# Patient Record
Sex: Male | Born: 1980 | Race: Black or African American | Hispanic: No | Marital: Single | Smoking: Current some day smoker
Health system: Southern US, Community
[De-identification: ages and names within clinical notes are randomized; demographics above are authoritative.]

## PROBLEM LIST (undated history)

## (undated) DIAGNOSIS — E119 Type 2 diabetes mellitus without complications: Secondary | ICD-10-CM

---
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
## 2001-05-07 ENCOUNTER — Inpatient Hospital Stay (HOSPITAL_COMMUNITY): Admission: EM | Admit: 2001-05-07 | Discharge: 2001-05-08 | Payer: Self-pay | Admitting: Emergency Medicine

## 2002-07-24 ENCOUNTER — Emergency Department (HOSPITAL_COMMUNITY): Admission: EM | Admit: 2002-07-24 | Discharge: 2002-07-25 | Payer: Self-pay | Admitting: *Deleted

## 2002-07-25 ENCOUNTER — Emergency Department (HOSPITAL_COMMUNITY): Admission: EM | Admit: 2002-07-25 | Discharge: 2002-07-25 | Payer: Self-pay | Admitting: Emergency Medicine

## 2002-07-26 ENCOUNTER — Emergency Department (HOSPITAL_COMMUNITY): Admission: EM | Admit: 2002-07-26 | Discharge: 2002-07-26 | Payer: Self-pay | Admitting: Emergency Medicine

## 2002-07-27 ENCOUNTER — Emergency Department (HOSPITAL_COMMUNITY): Admission: EM | Admit: 2002-07-27 | Discharge: 2002-07-28 | Payer: Self-pay | Admitting: Emergency Medicine

## 2002-07-28 ENCOUNTER — Inpatient Hospital Stay (HOSPITAL_COMMUNITY): Admission: EM | Admit: 2002-07-28 | Discharge: 2002-07-31 | Payer: Self-pay | Admitting: Surgery

## 2002-07-28 ENCOUNTER — Encounter: Payer: Self-pay | Admitting: Surgery

## 2005-03-09 ENCOUNTER — Emergency Department (HOSPITAL_COMMUNITY): Admission: EM | Admit: 2005-03-09 | Discharge: 2005-03-09 | Payer: Self-pay | Admitting: Emergency Medicine

## 2005-10-04 ENCOUNTER — Emergency Department (HOSPITAL_COMMUNITY): Admission: EM | Admit: 2005-10-04 | Discharge: 2005-10-04 | Payer: Self-pay | Admitting: Emergency Medicine

## 2013-11-15 ENCOUNTER — Ambulatory Visit (INDEPENDENT_AMBULATORY_CARE_PROVIDER_SITE_OTHER): Payer: Self-pay | Admitting: Family Medicine

## 2013-11-15 VITALS — BP 120/68 | HR 97 | Temp 98.0°F | Resp 16 | Ht 69.5 in | Wt 188.8 lb

## 2013-11-15 DIAGNOSIS — Z131 Encounter for screening for diabetes mellitus: Secondary | ICD-10-CM

## 2013-11-15 DIAGNOSIS — Z0289 Encounter for other administrative examinations: Secondary | ICD-10-CM

## 2013-11-15 LAB — POCT GLYCOSYLATED HEMOGLOBIN (HGB A1C): Hemoglobin A1C: 5.5

## 2013-11-15 NOTE — Progress Notes (Signed)
Chief Complaint:  Chief Complaint  Patient presents with  . Employment Physical    DOT    HPI: Mathew Wilkerson is a 33 y.o. 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 He was in the ER for this. He states he never took the Levemir.  He does not remember when he first had insulin, he does not remember when he was ever told he had Diabetes. HE does not rememebr any of this. HE thinks he remember he was given a sample at [employer] place since he had no income.  Again he states he never used it.   History reviewed. No pertinent past medical history. History reviewed. No pertinent past surgical history. History   Social History  . Marital Status: Single    Spouse Name: N/A    Number of Children: N/A  . Years of Education: N/A   Social History Main Topics  . Smoking status: Current Some Day Smoker  . Smokeless tobacco: None  . Alcohol Use: None  . Drug Use: None  . Sexual Activity: None   Other Topics Concern  . None   Social History Narrative  . None   No family history on file. No Known Allergies Prior to Admission medications   Not on File     ROS: The patient denies fevers, chills, night sweats, unintentional weight loss, chest pain, palpitations, wheezing, dyspnea on exertion, nausea, vomiting, abdominal pain, dysuria, hematuria, melena, numbness, weakness, or tingling.  All other systems have been reviewed and were otherwise negative with the exception of those mentioned in the HPI and as above.    PHYSICAL EXAM: Filed Vitals:   11/15/13 1407  BP: 120/68  Pulse: 97  Temp: 98 F (36.7 C)  Resp: 16   Filed  Vitals:   11/15/13 1407  Height: 5' 9.5" (1.765 m)  Weight: 188 lb 12.8 oz (85.639 kg)   Body mass index is 27.49 kg/(m^2).  General: Alert, no acute distress HEENT:  Normocephalic, atraumatic, oropharynx patent. EOMI, PERRLA, tm normal  Cardiovascular:  Regular rate and rhythm, no rubs murmurs or gallops.  No Carotid bruits, radial pulse intact. No pedal edema.  Respiratory: Clear to auscultation bilaterally.  No wheezes, rales, or rhonchi.  No cyanosis, no use of accessory musculature GI: No organomegaly, abdomen is soft and non-tender, positive bowel sounds.  No masses. Skin: No rashes. Neurologic: Facial musculature symmetric. Psychiatric: Patient is appropriate throughout our interaction. Lymphatic: No cervical lymphadenopathy Musculoskeletal: Gait intact. 5/5 strength, 2/2 DTRs Neg inguinal hernia Neg scoliosis    LABS: Results for orders placed in visit on 11/15/13  POCT GLYCOSYLATED HEMOGLOBIN (HGB A1C)      Result Value Ref Range   Hemoglobin A1C 5.5       EKG/XRAY:   Primary read interpreted by Dr. Conley Rolls at [facility].   ASSESSMENT/PLAN: Encounter Diagnoses  Name Primary?  . Other general medical examination for administrative purposes Yes  . Screening for diabetes mellitus (DM)    No e/o DM  Base on labs done in office Hgb A1c was  approved by branch manage Geralynn Ochs [phone] He will be given a 2 year DOT.  F/u with PCP once he gets insurance for annual visit.    Gross sideeffects, risk and benefits, and alternatives of medications d/w patient. Patient is aware that all medications have potential sideeffects and we are unable to predict every sideeffect or drug-drug interaction that may occur.  Hamilton Capri PHUONG, DO 11/15/2013 3:40 PM

## 2014-03-09 ENCOUNTER — Emergency Department (HOSPITAL_COMMUNITY)
Admission: EM | Admit: 2014-03-09 | Discharge: 2014-03-09 | Disposition: A | Discharge status: Home / Self Care | Payer: BLUE CROSS/BLUE SHIELD | Attending: Emergency Medicine | Admitting: Emergency Medicine

## 2014-03-09 ENCOUNTER — Emergency Department (HOSPITAL_COMMUNITY)
Admission: EM | Admit: 2014-03-09 | Discharge: 2014-03-09 | Disposition: A | Discharge status: Home / Self Care | Payer: BLUE CROSS/BLUE SHIELD | Source: Home / Self Care

## 2014-03-09 ENCOUNTER — Emergency Department (HOSPITAL_COMMUNITY): Payer: BLUE CROSS/BLUE SHIELD

## 2014-03-09 ENCOUNTER — Encounter (HOSPITAL_COMMUNITY): Payer: Self-pay | Admitting: Emergency Medicine

## 2014-03-09 DIAGNOSIS — R1011 Right upper quadrant pain: Secondary | ICD-10-CM | POA: Insufficient documentation

## 2014-03-09 DIAGNOSIS — R112 Nausea with vomiting, unspecified: Secondary | ICD-10-CM | POA: Insufficient documentation

## 2014-03-09 DIAGNOSIS — Z720 Tobacco use: Secondary | ICD-10-CM | POA: Diagnosis not present

## 2014-03-09 DIAGNOSIS — R109 Unspecified abdominal pain: Secondary | ICD-10-CM

## 2014-03-09 DIAGNOSIS — E1165 Type 2 diabetes mellitus with hyperglycemia: Secondary | ICD-10-CM | POA: Diagnosis not present

## 2014-03-09 DIAGNOSIS — R739 Hyperglycemia, unspecified: Secondary | ICD-10-CM

## 2014-03-09 DIAGNOSIS — R079 Chest pain, unspecified: Secondary | ICD-10-CM | POA: Diagnosis not present

## 2014-03-09 DIAGNOSIS — Z794 Long term (current) use of insulin: Secondary | ICD-10-CM | POA: Insufficient documentation

## 2014-03-09 DIAGNOSIS — R197 Diarrhea, unspecified: Secondary | ICD-10-CM | POA: Diagnosis present

## 2014-03-09 HISTORY — DX: Type 2 diabetes mellitus without complications: E11.9

## 2014-03-09 LAB — CBG MONITORING, ED
Glucose-Capillary: 422 mg/dL — ABNORMAL HIGH (ref 70–99)
Glucose-Capillary: 331 mg/dL — ABNORMAL HIGH (ref 70–99)
Glucose-Capillary: 251 mg/dL — ABNORMAL HIGH (ref 70–99)

## 2014-03-09 LAB — URINALYSIS, ROUTINE W REFLEX MICROSCOPIC
BILIRUBIN URINE: NEGATIVE
Glucose, UA: >1000 mg/dL — AB
Hgb urine dipstick: NEGATIVE
KETONES UR: 40 mg/dL — AB
LEUKOCYTES UA: NEGATIVE
Nitrite: NEGATIVE
PH: 6 (ref 5.0–8.0)
Protein, ur: NEGATIVE mg/dL
Specific Gravity, Urine: 1.037 — ABNORMAL HIGH (ref 1.005–1.030)
UROBILINOGEN UA: 0.2 mg/dL (ref 0.0–1.0)

## 2014-03-09 LAB — COMPREHENSIVE METABOLIC PANEL
ALT: 18 U/L (ref 0–53)
AST: 30 U/L (ref 0–37)
Albumin: 4.8 g/dL (ref 3.5–5.2)
Alkaline Phosphatase: 135 U/L — ABNORMAL HIGH (ref 39–117)
Anion gap: 15 (ref 5–15)
BUN: 13 mg/dL (ref 6–23)
CO2: 23 mmol/L (ref 19–32)
Calcium: 9.7 mg/dL (ref 8.4–10.5)
Chloride: 95 mEq/L — ABNORMAL LOW (ref 96–112)
Creatinine, Ser: 1.35 mg/dL (ref 0.50–1.35)
GFR calc Af Amer: 79 mL/min — ABNORMAL LOW (ref >90)
GFR calc non Af Amer: 68 mL/min — ABNORMAL LOW (ref >90)
Glucose, Bld: 414 mg/dL — ABNORMAL HIGH (ref 70–99)
Potassium: 4.5 mmol/L (ref 3.5–5.1)
Sodium: 133 mmol/L — ABNORMAL LOW (ref 135–145)
Total Bilirubin: 2.2 mg/dL — ABNORMAL HIGH (ref 0.3–1.2)
Total Protein: 8.2 g/dL (ref 6.0–8.3)

## 2014-03-09 LAB — CBC WITH DIFFERENTIAL/PLATELET
Basophils Absolute: 0 10*3/uL (ref 0.0–0.1)
Basophils Relative: 0 % (ref 0–1)
Eosinophils Absolute: 0.1 10*3/uL (ref 0.0–0.7)
Eosinophils Relative: 1 % (ref 0–5)
HCT: 42.8 % (ref 39.0–52.0)
Hemoglobin: 15.1 g/dL (ref 13.0–17.0)
Lymphocytes Relative: 24 % (ref 12–46)
Lymphs Abs: 1.4 10*3/uL (ref 0.7–4.0)
MCH: 34.8 pg — ABNORMAL HIGH (ref 26.0–34.0)
MCHC: 35.3 g/dL (ref 30.0–36.0)
MCV: 98.6 fL (ref 78.0–100.0)
Monocytes Absolute: 0.5 10*3/uL (ref 0.1–1.0)
Monocytes Relative: 8 % (ref 3–12)
Neutro Abs: 4.1 10*3/uL (ref 1.7–7.7)
Neutrophils Relative %: 67 % (ref 43–77)
Platelets: 304 10*3/uL (ref 150–400)
RBC: 4.34 MIL/uL (ref 4.22–5.81)
RDW: 11.4 % — ABNORMAL LOW (ref 11.5–15.5)
WBC: 6.1 10*3/uL (ref 4.0–10.5)

## 2014-03-09 LAB — I-STAT CHEM 8, ED
BUN: 11 mg/dL (ref 6–23)
CALCIUM ION: 1.19 mmol/L (ref 1.12–1.23)
Chloride: 102 mEq/L (ref 96–112)
Creatinine, Ser: 0.9 mg/dL (ref 0.50–1.35)
Glucose, Bld: 270 mg/dL — ABNORMAL HIGH (ref 70–99)
HCT: 43 % (ref 39.0–52.0)
HEMOGLOBIN: 14.6 g/dL (ref 13.0–17.0)
Potassium: 3.9 mmol/L (ref 3.5–5.1)
SODIUM: 139 mmol/L (ref 135–145)
TCO2: 22 mmol/L (ref 0–100)

## 2014-03-09 LAB — URINE MICROSCOPIC-ADD ON

## 2014-03-09 LAB — KETONES, QUALITATIVE: Acetone, Bld: NEGATIVE

## 2014-03-09 LAB — TROPONIN I

## 2014-03-09 MED ORDER — SODIUM CHLORIDE 0.9 % IV BOLUS (SEPSIS)
1000.0000 mL | Freq: Once | INTRAVENOUS | Status: AC
  Administered 2014-03-09: 1000 mL via INTRAVENOUS

## 2014-03-09 MED ORDER — INSULIN DETEMIR 100 UNIT/ML ~~LOC~~ SOLN: 10.0000 [IU] | Freq: Every day | SUBCUTANEOUS | Status: DC

## 2014-03-09 MED ORDER — SODIUM CHLORIDE 0.9 % IV BOLUS (SEPSIS)
500.0000 mL | Freq: Once | INTRAVENOUS | Status: AC
  Administered 2014-03-09: 500 mL via INTRAVENOUS

## 2014-03-09 NOTE — ED Notes (Signed)
Care Management at bedside

## 2014-03-09 NOTE — ED Notes (Signed)
Pt discharged by Donnetta SimpersJanee Crews

## 2014-03-09 NOTE — ED Provider Notes (Signed)
CSN: [id number]     Arrival date & time 03/09/14  1512 History   First MD Initiated Contact with Patient 03/09/14 1724     Chief Complaint  Patient presents with  . Emesis  . Diarrhea     (Consider location/radiation/quality/duration/timing/severity/associated sxs/prior Treatment) The history is provided by the patient. No language interpreter was used.  Mathew Wilkerson is a 34 year old male with past medical history of type I diabetes presenting to the emergency department with nausea, vomiting, diarrhea that is been ongoing since Saturday. Patient reports that most of the diarrhea and emesis was on Saturday. Reported that on Saturday had approximately 3-4 episodes of emesis-mainly of food contents, NB/NB. Reported that since Saturday the episodes of emesis have decreased, reported that he had one episode of emesis today-mainly of food contents. Reported that he has been having diarrhea intermittently but has not subsided since Saturday-reported 2 episodes of watery diarrhea on Saturday, stated that the stools have now returned to normal. Stated that he's been eating lite - mainly of chicken and rice. Stated that he has been having some abdominal discomfort described as a cramping, soreness sensation worse with vomiting and diarrhea. Stated that he has been having chest pain localized left-sided chest described as palpitations. Stated he's been feeling lightheaded and dizzy. Stated that he has been having increased urinary frequency and thirst. Reported that he's been taking insulin, normally supposed to be taking Levemir, but has been taking insulin that he can afford and goes on a sliding scale. Reported that his last sugar was calculated yesterday at 245. Denied shortness of breath, difficulty breathing, changes to gas, neck pain, fever, hematuria, melena, hematochezia, syncope, sick contacts. PCP none  Past Medical History  Diagnosis Date  . Diabetes mellitus without complication    History  reviewed. No pertinent past surgical history. History reviewed. No pertinent family history. History  Substance Use Topics  . Smoking status: Current Some Day Smoker  . Smokeless tobacco: Not on file  . Alcohol Use: Yes    Review of Systems  Constitutional: Positive for chills. Negative for fever.  Eyes: Negative for visual disturbance.  Respiratory: Negative for chest tightness and shortness of breath.   Cardiovascular: Positive for chest pain.  Gastrointestinal: Positive for nausea, vomiting, abdominal pain and diarrhea. Negative for constipation, blood in stool and anal bleeding.  Endocrine: Positive for polyphagia and polyuria.  Genitourinary: Positive for frequency. Negative for dysuria, decreased urine volume and enuresis.  Musculoskeletal: Negative for back pain and neck pain.  Neurological: Positive for light-headedness. Negative for dizziness, weakness and headaches.      Allergies  Review of patient's allergies indicates no known allergies.  Home Medications   Prior to Admission medications   Medication Sig Start Date End Date Taking? Authorizing Provider  INSULIN REGULAR HUMAN IN Inject 5 Units as directed 2 (two) times daily.   Yes Historical Provider, MD  insulin detemir (LEVEMIR) 100 UNIT/ML injection Inject 0.1 mLs (10 Units total) into the skin daily. 03/09/14   Pauline Trainer, PA-C   BP 117/74 mmHg  Pulse 83  Temp(Src) 98.1 F (36.7 C) (Oral)  Resp 16  SpO2 99% Physical Exam  Constitutional: He is oriented to person, place, and time. He appears well-developed and well-nourished. No distress.  HENT:  Head: Normocephalic and atraumatic.  Mouth/Throat: Oropharynx is clear and moist. No oropharyngeal exudate.  Eyes: Conjunctivae and EOM are normal. Right eye exhibits no discharge. Left eye exhibits no discharge.  Neck: Normal range of motion. Neck supple.  No tracheal deviation present.  Cardiovascular: Normal rate, regular rhythm and normal heart sounds.   Exam reveals no friction rub.   No murmur heard. Pulmonary/Chest: Effort normal and breath sounds normal. No respiratory distress. He has no wheezes. He has no rales.  Abdominal: Soft. Bowel sounds are normal. He exhibits no distension. There is tenderness in the right upper quadrant. There is no rebound and no guarding.  Negative abdominal distention Bowel sounds normoactive in all 4 quadrants Abdomen soft upon palpation Mild discomfort upon palpation to the right upper quadrant Negative peritoneal signs  Musculoskeletal: Normal range of motion.  Full ROM to upper and lower extremities without difficulty noted, negative ataxia noted.  Lymphadenopathy:    He has no cervical adenopathy.  Neurological: He is alert and oriented to person, place, and time. No cranial nerve deficit. He exhibits normal muscle tone. Coordination normal.  Skin: Skin is warm and dry. No rash noted. He is not diaphoretic. No erythema.  Psychiatric: He has a normal mood and affect. His behavior is normal. Thought content normal.  Nursing note and vitals reviewed.   ED Course  Procedures (including critical care time)  Results for orders placed or performed during the hospital encounter of 03/09/14  CBC with Differential  Result Value Ref Range   WBC 6.1 4.0 - 10.5 K/uL   RBC 4.34 4.22 - 5.81 MIL/uL   Hemoglobin 15.1 13.0 - 17.0 g/dL   HCT 16.1 09.6 - 04.5 %   MCV 98.6 78.0 - 100.0 fL   MCH 34.8 (H) 26.0 - 34.0 pg   MCHC 35.3 30.0 - 36.0 g/dL   RDW 40.9 (L) 81.1 - 91.4 %   Platelets 304 150 - 400 K/uL   Neutrophils Relative % 67 43 - 77 %   Neutro Abs 4.1 1.7 - 7.7 K/uL   Lymphocytes Relative 24 12 - 46 %   Lymphs Abs 1.4 0.7 - 4.0 K/uL   Monocytes Relative 8 3 - 12 %   Monocytes Absolute 0.5 0.1 - 1.0 K/uL   Eosinophils Relative 1 0 - 5 %   Eosinophils Absolute 0.1 0.0 - 0.7 K/uL   Basophils Relative 0 0 - 1 %   Basophils Absolute 0.0 0.0 - 0.1 K/uL  Comprehensive metabolic panel  Result Value Ref  Range   Sodium 133 (L) 135 - 145 mmol/L   Potassium 4.5 3.5 - 5.1 mmol/L   Chloride 95 (L) 96 - 112 mEq/L   CO2 23 19 - 32 mmol/L   Glucose, Bld 414 (H) 70 - 99 mg/dL   BUN 13 6 - 23 mg/dL   Creatinine, Ser 7.82 0.50 - 1.35 mg/dL   Calcium 9.7 8.4 - 95.6 mg/dL   Total Protein 8.2 6.0 - 8.3 g/dL   Albumin 4.8 3.5 - 5.2 g/dL   AST 30 0 - 37 U/L   ALT 18 0 - 53 U/L   Alkaline Phosphatase 135 (H) 39 - 117 U/L   Total Bilirubin 2.2 (H) 0.3 - 1.2 mg/dL   GFR calc non Af Amer 68 (L) >90 mL/min   GFR calc Af Amer 79 (L) >90 mL/min   Anion gap 15 5 - 15  Urinalysis, Routine w reflex microscopic  Result Value Ref Range   Color, Urine YELLOW YELLOW   APPearance CLEAR CLEAR   Specific Gravity, Urine 1.037 (H) 1.005 - 1.030   pH 6.0 5.0 - 8.0   Glucose, UA >1000 (A) NEGATIVE mg/dL   Hgb urine dipstick NEGATIVE NEGATIVE  Bilirubin Urine NEGATIVE NEGATIVE   Ketones, ur 40 (A) NEGATIVE mg/dL   Protein, ur NEGATIVE NEGATIVE mg/dL   Urobilinogen, UA 0.2 0.0 - 1.0 mg/dL   Nitrite NEGATIVE NEGATIVE   Leukocytes, UA NEGATIVE NEGATIVE  Ketones, qualitative  Result Value Ref Range   Acetone, Bld NEGATIVE NEGATIVE  Troponin I  Result Value Ref Range   Troponin I <0.03 <0.031 ng/mL  Urine microscopic-add on  Result Value Ref Range   Squamous Epithelial / LPF RARE RARE  CBG monitoring, ED  Result Value Ref Range   Glucose-Capillary 422 (H) 70 - 99 mg/dL  CBG monitoring, ED  Result Value Ref Range   Glucose-Capillary 331 (H) 70 - 99 mg/dL  I-stat chem 8, ed  Result Value Ref Range   Sodium 139 135 - 145 mmol/L   Potassium 3.9 3.5 - 5.1 mmol/L   Chloride 102 96 - 112 mEq/L   BUN 11 6 - 23 mg/dL   Creatinine, Ser 1.610.90 0.50 - 1.35 mg/dL   Glucose, Bld 096270 (H) 70 - 99 mg/dL   Calcium, Ion 0.451.19 4.091.12 - 1.23 mmol/L   TCO2 22 0 - 100 mmol/L   Hemoglobin 14.6 13.0 - 17.0 g/dL   HCT 81.143.0 91.439.0 - 78.252.0 %  CBG monitoring, ED  Result Value Ref Range   Glucose-Capillary 251 (H) 70 - 99 mg/dL     Labs Review Labs Reviewed  CBC WITH DIFFERENTIAL - Abnormal; Notable for the following:    MCH 34.8 (*)    RDW 11.4 (*)    All other components within normal limits  COMPREHENSIVE METABOLIC PANEL - Abnormal; Notable for the following:    Sodium 133 (*)    Chloride 95 (*)    Glucose, Bld 414 (*)    Alkaline Phosphatase 135 (*)    Total Bilirubin 2.2 (*)    GFR calc non Af Amer 68 (*)    GFR calc Af Amer 79 (*)    All other components within normal limits  URINALYSIS, ROUTINE W REFLEX MICROSCOPIC - Abnormal; Notable for the following:    Specific Gravity, Urine 1.037 (*)    Glucose, UA >1000 (*)    Ketones, ur 40 (*)    All other components within normal limits  CBG MONITORING, ED - Abnormal; Notable for the following:    Glucose-Capillary 422 (*)    All other components within normal limits  CBG MONITORING, ED - Abnormal; Notable for the following:    Glucose-Capillary 331 (*)    All other components within normal limits  I-STAT CHEM 8, ED - Abnormal; Notable for the following:    Glucose, Bld 270 (*)    All other components within normal limits  CBG MONITORING, ED - Abnormal; Notable for the following:    Glucose-Capillary 251 (*)    All other components within normal limits  KETONES, QUALITATIVE  TROPONIN I  URINE MICROSCOPIC-ADD ON    Imaging Review [place] Abdomen Limited Ruq  03/09/2014   CLINICAL DATA:  Abdominal pain. History of diabetes. Initial encounter.  EXAM: US ABDOMEN LIMITED - RIGHT UPPER QUADRANT  COMPARISON:  None.  FINDINGS: Gallbladder:  No gallstones or wall thickening visualized. No sonographic Murphy sign noted.  Common bile duct:  Diameter: 2.5 mm.  No evidence of choledocholithiasis.  Liver:  No focal lesion identified. Within normal limits in parenchymal echogenicity.  IMPRESSION: Normal right upper quadrant abdominal ultrasound.   Electronically Signed   By: Roxy HorsemanBill  Veazey M.D.   On: 03/09/2014 21:26  EKG Interpretation   Date/Time:  Wednesday  March 09 2014 18:00:52 EST Ventricular Rate:  89 PR Interval:  187 QRS Duration: 81 QT Interval:  397 QTC Calculation: 483 R Axis:   93 Text Interpretation:  Sinus rhythm Borderline right axis deviation ST  elev, probable normal early repol pattern Borderline prolonged QT interval  No old tracing to compare Confirmed by [facility]  MD, DAVID ([id number]) on  03/09/2014 6:35:16 PM      8:49 PM This provider spoke with patient - recommended patient to be admitted to the hospital for IV fluids secondary to dehydration and poor control of glucose. Concerned secondary to patient being type I diabetic and using random insulin and random dosages of insulin for control-patient purchases insulin that he can afford at that time and varies in types of insulin. Patient does not one to be admitted, patient reported that he would like to go back to work tomorrow. Patient reports that he is feeling much better and that he would like to be discharged home at this time.  9:12 PM This provider spoke with Burna Mortimer, Case Management - discussed case without medications and PCP. Case management to see patient.   9:27 PM Burna Mortimer, Case manager, saw and assess patient. Reported that since patient has insurance, [employer] and [facility] patient can get Levemir fully covered. Highly recommended admission as well, patient continued to refuse secondary to starting a new job 2 months ago.  10:34 PM This provider discussed case in great detail with attending physician, Dr. Algis Downs. Ray reported that patient can be discharge if he does not want to stay in the hospital. Recommended patient being placed on Levemir if this worked in the past. Recommended pharmacy consult regarding dose of Levemir for the patient.  10:45 PM This provider spoke with Tammy Sours, Pharmacist. Discussed case in great detail. Recommended basic Levemir 10 Units per day - recommended for patient to discontinue the insulin regular, just to take Levemir as prescribed. Recommended  HbA1c to be rechecked in next visit.   MDM   Final diagnoses:  Abdominal pain  Hyperglycemia    Medications  sodium chloride 0.9 % bolus 1,000 mL (0 mLs Intravenous Stopped 03/09/14 2052)  sodium chloride 0.9 % bolus 500 mL (0 mLs Intravenous Stopped 03/09/14 2101)    Filed Vitals:   03/09/14 1800 03/09/14 1830 03/09/14 2100 03/09/14 2108  BP: 148/84 131/75 117/74 117/74  Pulse:    83  Temp:      TempSrc:      Resp: 18 23 10 16   SpO2:    99%   EKG noted sinus rhythm with a heart rate of 89 bpm with a right axis deviation, early repolarization-no old tracing. Troponin negative elevation. CBC negative elevated leukocytosis. Hemoglobin 15.1, hematocrit 42.8. CMP noted mild hyponatremia of 133. Alkaline phosphatase elevated at 135, bilirubin elevated at 2.2. Glucose elevated at 414 with negative elevated anion gap-15.0 mg/L. Ketones negative. Urinalysis noted-negative hemoglobin, nitrites or leukocytes. Ketones elevated at 40. Specific gravity elevated at 1.037. Right upper quadrant ultrasound unremarkable. Doubt DKA. Doubt acute cholecystitis/cholangitis. Patient given IV fluids while in ED setting. Repeat chem 8 after IV fluids administered noted glucose decreasing from 442 to 270 - anion gap negative elevation - 15.0 mg/L. Patient appeared to be dehydrated upon arrival to the ED-denied chest palpitations, chest pain, shortness of breath or difficulty breathing. Patient reported that he feels a lot better after the fluids were administered. Patient was seen and assessed by case management, as per  case management reported the patient is able to get Levemir secondary to insurance - has not been on Levemir since 2012. Appointment for outpatient follow-up will be scheduled for patient. Patient had a long discussion with case management regarding checking blood sugars daily-reported that strips can be purchased at [facility] or any drugstore. Patient does not appear to be in DKA. Patient presenting to  the ED with dehydration and hyperglycemia secondary to poor glucose control due to not using the proper medications/insulin. Patient stable, afebrile. Patient not septic appearing. This provider recommended admission for observation and IV fluid hydration-patient declines stating that he is unable to stay secondary to just starting his new job 2 months ago. Discussed concern, dangers of poor glucose control and type I diabetics such as DKA and death-patient understood and continued to decline admission. Patient seen and assessed by attending physician, Dr. Charlann Boxer - recommended patient to discharged if patient does not want to stay and recommended pharmacy to be consulted. Pharmacy consulted and recommended Levemir 10 U daily. Patient has not had an episode of emesis while in the ED setting. Patient stable, afebrile. Patient not septic appearing. Discussed with patient to rest and stay hydrated. Discussed with patient importance of monitoring sugar levels. Discussed with patient to keep appointment on Monday, 03/14/2014. Discussed with patient to closely monitor symptoms and if symptoms are to worsen or change to report back to the ED - strict return instructions given.  Patient agreed to plan of care, understood, all questions answered.   Raymon Mutton, PA-C 03/09/14 1610  Hilario Quarry, MD 03/09/14 [phone]

## 2014-03-09 NOTE — ED Notes (Signed)
CBG 422.Marland Kitchen.Marland Kitchen.Nurse notified

## 2014-03-09 NOTE — Discharge Instructions (Signed)
Please call your doctor for a followup appointment within 24-48 hours. When you talk to your doctor please let them know that you were seen in the emergency department and have them acquire all of your records so that they can discuss the findings with you and formulate a treatment plan to fully care for your new and ongoing problems. Please keep appointment with Clinic on Monday, 03/14/2014 at 10:00AM.  Please take Levemir as prescribed Please discontinue the insulin regular that you have been taking Please rest and stay hydrated Please continue to monitor sugar levels daily Please continue to monitor symptoms closely and if symptoms are to worsen or change (fever greater than 101, chills, sweating, nausea, vomiting, chest pain, shortness of breathe, difficulty breathing, weakness, numbness, tingling, worsening or changes to pain pattern, sweating, dizziness, headache, visual changes, increased urination, increased thirst) please report back to the Emergency Department immediately.    Blood Glucose Monitoring Monitoring your blood glucose (also know as blood sugar) helps you to manage your diabetes. It also helps you and your health care provider monitor your diabetes and determine how well your treatment plan is working. WHY SHOULD YOU MONITOR YOUR BLOOD GLUCOSE?  It can help you understand how food, exercise, and medicine affect your blood glucose.  It allows you to know what your blood glucose is at any given moment. You can quickly tell if you are having low blood glucose (hypoglycemia) or high blood glucose (hyperglycemia).  It can help you and your health care provider know how to adjust your medicines.  It can help you understand how to manage an illness or adjust medicine for exercise. WHEN SHOULD YOU TEST? Your health care provider will help you decide how often you should check your blood glucose. This may depend on the type of diabetes you have, your diabetes control, or the types of  medicines you are taking. Be sure to write down all of your blood glucose readings so that this information can be reviewed with your health care provider. See below for examples of testing times that your health care provider may suggest. Type 1 Diabetes  Test 4 times a day if you are in good control, using an insulin pump, or perform multiple daily injections.  If your diabetes is not well controlled or if you are sick, you may need to monitor more often.  It is a good idea to also monitor:  Before and after exercise.  Between meals and 2 hours after a meal.  Occasionally between 2:00 a.m. and 3:00 a.m. Type 2 Diabetes  It can vary with each person, but generally, if you are on insulin, test 4 times a day.  If you take medicines by mouth (orally), test 2 times a day.  If you are on a controlled diet, test once a day.  If your diabetes is not well controlled or if you are sick, you may need to monitor more often. HOW TO MONITOR YOUR BLOOD GLUCOSE Supplies Needed  Blood glucose meter.  Test strips for your meter. Each meter has its own strips. You must use the strips that go with your own meter.  A pricking needle (lancet).  A device that holds the lancet (lancing device).  A journal or log book to write down your results. Procedure  Wash your hands with soap and water. Alcohol is not preferred.  Prick the side of your finger (not the tip) with the lancet.  Gently milk the finger until a small drop of blood appears.  Follow the instructions that come with your meter for inserting the test strip, applying blood to the strip, and using your blood glucose meter. Other Areas to Get Blood for Testing Some meters allow you to use other areas of your body (other than your finger) to test your blood. These areas are called alternative sites. The most common alternative sites are:  The forearm.  The thigh.  The back area of the lower leg.  The palm of the hand. The blood  flow in these areas is slower. Therefore, the blood glucose values you get may be delayed, and the numbers are different from what you would get from your fingers. Do not use alternative sites if you think you are having hypoglycemia. Your reading will not be accurate. Always use a finger if you are having hypoglycemia. Also, if you cannot feel your lows (hypoglycemia unawareness), always use your fingers for your blood glucose checks. ADDITIONAL TIPS FOR GLUCOSE MONITORING  Do not reuse lancets.  Always carry your supplies with you.  All blood glucose meters have a 24-hour "hotline" number to call if you have questions or need help.  Adjust (calibrate) your blood glucose meter with a control solution after finishing a few boxes of strips. BLOOD GLUCOSE RECORD KEEPING It is a good idea to keep a daily record or log of your blood glucose readings. Most glucose meters, if not all, keep your glucose records stored in the meter. Some meters come with the ability to download your records to your home computer. Keeping a record of your blood glucose readings is especially helpful if you are wanting to look for patterns. Make notes to go along with the blood glucose readings because you might forget what happened at that exact time. Keeping good records helps you and your health care provider to work together to achieve good diabetes management.  Document Released: 02/14/2003 Document Revised: 06/28/2013 Document Reviewed: 07/06/2012 [facility] Patient Information 2015 [place], [place]. This information is not intended to replace advice given to you by your health care provider. Make sure you discuss any questions you have with your health care provider.  Hyperglycemia Hyperglycemia occurs when the glucose (sugar) in your blood is too high. Hyperglycemia can happen for many reasons, but it most often happens to people who do not know they have diabetes or are not managing their diabetes properly.  CAUSES    Whether you have diabetes or not, there are other causes of hyperglycemia. Hyperglycemia can occur when you have diabetes, but it can also occur in other situations that you might not be as aware of, such as: Diabetes  If you have diabetes and are having problems controlling your blood glucose, hyperglycemia could occur because of some of the following reasons:  Not following your meal plan.  Not taking your diabetes medications or not taking it properly.  Exercising less or doing less activity than you normally do.  Being sick. Pre-diabetes  This cannot be ignored. Before people develop Type 2 diabetes, they almost always have "pre-diabetes." This is when your blood glucose levels are higher than normal, but not yet high enough to be diagnosed as diabetes. Research has shown that some long-term damage to the body, especially the heart and circulatory system, may already be occurring during pre-diabetes. If you take action to manage your blood glucose when you have pre-diabetes, you may delay or prevent Type 2 diabetes from developing. Stress  If you have diabetes, you may be "diet" controlled or on oral medications  or insulin to control your diabetes. However, you may find that your blood glucose is higher than usual in the hospital whether you have diabetes or not. This is often referred to as "stress hyperglycemia." Stress can elevate your blood glucose. This happens because of hormones put out by the body during times of stress. If stress has been the cause of your high blood glucose, it can be followed regularly by your caregiver. That way he/she can make sure your hyperglycemia does not continue to get worse or progress to diabetes. Steroids  Steroids are medications that act on the infection fighting system (immune system) to block inflammation or infection. One side effect can be a rise in blood glucose. Most people can produce enough extra insulin to allow for this rise, but for those  who cannot, steroids make blood glucose levels go even higher. It is not unusual for steroid treatments to "uncover" diabetes that is developing. It is not always possible to determine if the hyperglycemia will go away after the steroids are stopped. A special blood test called an A1c is sometimes done to determine if your blood glucose was elevated before the steroids were started. SYMPTOMS  Thirsty.  Frequent urination.  Dry mouth.  Blurred vision.  Tired or fatigue.  Weakness.  Sleepy.  Tingling in feet or leg. DIAGNOSIS  Diagnosis is made by monitoring blood glucose in one or all of the following ways:  A1c test. This is a chemical found in your blood.  Fingerstick blood glucose monitoring.  Laboratory results. TREATMENT  First, knowing the cause of the hyperglycemia is important before the hyperglycemia can be treated. Treatment may include, but is not be limited to:  Education.  Change or adjustment in medications.  Change or adjustment in meal plan.  Treatment for an illness, infection, etc.  More frequent blood glucose monitoring.  Change in exercise plan.  Decreasing or stopping steroids.  Lifestyle changes. HOME CARE INSTRUCTIONS   Test your blood glucose as directed.  Exercise regularly. Your caregiver will give you instructions about exercise. Pre-diabetes or diabetes which comes on with stress is helped by exercising.  Eat wholesome, balanced meals. Eat often and at regular, fixed times. Your caregiver or nutritionist will give you a meal plan to guide your sugar intake.  Being at an ideal weight is important. If needed, losing as little as 10 to 15 pounds may help improve blood glucose levels. SEEK MEDICAL CARE IF:   You have questions about medicine, activity, or diet.  You continue to have symptoms (problems such as increased thirst, urination, or weight gain). SEEK IMMEDIATE MEDICAL CARE IF:   You are vomiting or have diarrhea.  Your  breath smells fruity.  You are breathing faster or slower.  You are very sleepy or incoherent.  You have numbness, tingling, or pain in your feet or hands.  You have chest pain.  Your symptoms get worse even though you have been following your caregiver's orders.  If you have any other questions or concerns. Document Released: 08/07/2000 Document Revised: 05/06/2011 Document Reviewed: 06/10/2011 [facility] Patient Information 2015 [place], [place]. This information is not intended to replace advice given to you by your health care provider. Make sure you discuss any questions you have with your health care provider.   Emergency Department Resource Guide 1) Find a Doctor and Pay Out of Pocket Although you won't have to find out who is covered by your insurance plan, it is a good idea to ask around and get  recommendations. You will then need to call the office and see if the doctor you have chosen will accept you as a new patient and what types of options they offer for patients who are self-pay. Some doctors offer discounts or will set up payment plans for their patients who do not have insurance, but you will need to ask so you aren't surprised when you get to your appointment.  2) Contact Your Local Health Department Not all health departments have doctors that can see patients for sick visits, but many do, so it is worth a call to see if yours does. If you don't know where your local health department is, you can check in your phone book. The CDC also has a tool to help you locate your state's health department, and many state websites also have listings of all of their local health departments.  3) Find a Walk-in Clinic If your illness is not likely to be very severe or complicated, you may want to try a walk in clinic. These are popping up all over the country in pharmacies, drugstores, and shopping centers. They're usually staffed by nurse practitioners or physician assistants that have  been trained to treat common illnesses and complaints. They're usually fairly quick and inexpensive. However, if you have serious medical issues or chronic medical problems, these are probably not your best option.  No Primary Care Doctor: - Call Health Connect at  [phone] - they can help you locate a primary care doctor that  accepts your insurance, provides certain services, etc. - Physician Referral Service- [phone]  Chronic Pain Problems: Organization         Address  Phone   Notes  [facility]  [phone] Patients need to be referred by their primary care doctor.   Medication Assistance: Organization         Address  Phone   Notes  [facility] Medication [facility] [address] [place] [place], [place] [address] [phone] --Must be a resident of [facility] -- Must have NO insurance coverage whatsoever (no Medicaid/ Medicare, etc.) -- The pt. MUST have a primary care doctor that directs their care regularly and follows them in the community   [facility]  [phone]   [employer]  [phone]    Agencies that provide inexpensive medical care: Organization         Address  Phone   Notes  [facility]  [phone]   [facility]    [phone]   [facility] [address] [place], [place] [address] [phone]   [facility] [address] [place], [place] [phone]   [facility]anned Parenthood    [phone]   [facility]    [phone]   [facility]  [address], [place] Phone:  [phone], Fax:  [phone] Hours of Operation:  9 am - 6 pm, M-F.  Also accepts Medicaid/Medicare and self-pay.  [facility]  [address], [place] Phone: [phone], Fax: [phone]. Hours of Operation:  8:30 am - 5:30 pm, M-F.  Also accepts Medicaid and  self-pay.  [facility] [place] [address] [place] Phone: [phone]   [facility] [address] [place], [place] [phone], Ext. [phone] Mondays & Thursdays: 7-9 AM.  First 15 patients are seen on a first  come, first serve basis.    Medicaid-accepting [facility] Providers:  Organization         Address  Phone   Notes  [facility] [address], [place] [phone] Also accepts self-pay patients.  [facility] [address] Laurell Josephs [place], [place]  [phone]   [facility] [address], [place] [phone]   [facility] [address], [place] [phone]   Renaye Rakers [address], [place]   [phone] Only accepts [place] patients after they have their name applied to their card.   Self-Pay (no insurance) in [facility]:  Organization         Address  Phone   Notes  Sickle Cell Patients, [facility] Internal Medicine [address] [place], [place] [phone]   [facility] [address] [place], [place] [phone]   [facility] [place]  [address],  [phone]   [facility]/Dr. Osei-Bonsu  [address], [place] or [address], [place] [phone] Phone number for both [place] and [place] locations is the same.  [facility] [address], [place] [phone]   [facility] [address] [place] or [address] [phone]   [facility] [address] [place] [phone], phone; [phone], fax Sees patients 1st and 3rd Saturday of every month.  Must not qualify for public or private insurance (i.e. Medicaid, Medicare, [place] Health Choice, Veterans' Benefits)  Household income  should be no more than 200% of the poverty level The clinic cannot treat you if you are pregnant or think you are pregnant  Sexually transmitted diseases are not treated at the clinic.    Dental Care: Organization         Address  Phone  Notes  [facility] [address] [place], [place] [phone] Accepts children up to age 98 who are enrolled in [place] or [place] Health Choice; pregnant women with a Medicaid card; and children who have applied for Medicaid or [place] Health Choice, but were declined, whose parents can pay a reduced fee at time of service.  [facility]  [address], [place] [phone] Accepts children up to age 52 who are enrolled in [place] or [place] Health Choice; pregnant women with a Medicaid card; and children who have applied for Medicaid or [place] Health Choice, but were declined, whose parents can pay a reduced fee at time of service.  [facility]  [address] [place], [place] [phone] Patients are seen by appointment only. Walk-ins are not accepted. [facility] will see patients 67 years of age and older. Monday - Tuesday (8am-5pm) Most Wednesdays (8:30-5pm) $30 per visit, cash only  [facility] Adult Dental Access PRO[facility]  [address], [facility] [phone] Patients are seen by appointment only. Walk-ins are not accepted. [facility] will see patients 54 years of age and older. One Wednesday Evening (Monthly: Volunteer Based).  $30 per visit, cash only  [employer]  [phone] for adults; Children under age 78, call Graduate Pediatric Dentistry at [phone]. Children aged 66-14, please  call [phone] to request a pediatric application.  Dental services are provided in all areas of dental care including fillings, crowns and bridges, complete and partial dentures, implants, gum treatment,  root canals, and extractions. Preventive care is also provided. Treatment is provided to both adults and children. Patients are selected via a lottery and there is often a waiting list.   [facility] [address], [place]  [phone] [email or web address]   [facility] [address], [place], [place] [phone], Ext. [phone] Second and Fourth Thursday of each month, opens at 6:30 AM; Clinic ends at 9 AM.  Patients are seen on a first-come first-served basis, and a limited number are seen during each clinic.   [facility]  [address], [place], [place] [phone]   Eligibility Requirements You must have lived in [place], [place], or [place] for at least the last three months.   You cannot be eligible for state or federal sponsored [place]healthcare insurance, including CIG[employer]eterans Administration, [place]icaid, or [employer]icare.   You generally cannot be eligible for healthcare insurance through your employer.    How to apply: Eligibility screenings are held every Tuesday and Wednesday afternoon from 1:00 pm until 4:00 pm. You do not need an appointment for the interview!  [facility] [address], [place], [place] [phone]   [facility]  [phone]   [facility]  [phone]   [facility]  [phone]    Behavioral Health Resources in the Community: Intensive Outpatient Programs Organization         Address  Phone  Notes  [facility] [address], [place], [place] [phone]   [facility] [address], [place], [place] [phone]   [facility] [address], [place], [place]  [phone]   [facility] [address],  [place], [place] [phone] or [phone]   Substance Abuse Resources Organization         Address  Phone  Notes  [facility]   [phone]   [facility]  [phone]   [facility]  [phone]   [facility]  [phone]   Residential & Outpatient Substance Abuse Program  [phone]   Psychological Services Organization         Address  Phone  Notes  [facility]  [phone]- [phone]   [facility]  [phone]- [phone]   [facility] [address], [place] [phone] or [phone]    Mobile Crisis Teams Organization         Address  Phone  Notes  [facility], Mobile Crisis Care Unit  [phone]   [facility]  [address] [place], [place] [phone]   Doristine LocksSharon DeEsch [address] [place] [phone]    Self-Help/Support Groups Organization         Address  Phone             Notes  [facility] [place] - variety of support groups  [phone]- [phone] Call for more information  Narcotics Anonymous (NA), [facility] [address], [place]  2 meetings at this location   Statisticianesidential Treatment Programs Organization         Address  Phone  Notes  [facility] [address],    [place]  [phone]   [facility]  [address], [place] [address], [place], [place] [phone]   [facility] [address] [place], [place] [phone] Admissions: 8am-3pm M-F  [facility] [address]    [place], [place] [phone]   [facility] [address] [place], [place], [place] [phone]   [facility] [address]  [place], [place] [phone]   [facility] - Intensive Outpatient [address]ure[address], [place], [place] [phone]   [facility] ([facility] [address]  [place], [place] [phone] or [phone]   [facility] ([facility]) [address], [place], [place] [phone] Accepts Medicaid  [facility] [address],  [place] [phone] Substance Abuse/Addiction Treatment   [facility] Organization         Address  Phone  Notes  [facility]  [phone]   Angie Fava, PhD [address] Ervin Knack [place], [place]   [phone] or [phone]   [facility]   [address] [place], [place] [phone]   [facility] [address], [place], [place] [phone] Insurance/Medicaid/sponsorship through [facility] [address]                                    [place], [place] [phone] Therapy/tele-psych/case  [facility] [address], [place] [phone]    Dr. Lolly [place]  [phone]   [facility]  [employer] [facility] Dept. 1) [address], [place] 2) [address], [place] 3)  [address], [place] [phone]  [phone]   [facility] Child Abuse Hotline 432 204 6475 or [phone] (After Hours)

## 2014-03-09 NOTE — ED Notes (Signed)
Patient transported to Ultrasound 

## 2014-03-09 NOTE — ED Notes (Signed)
Pt c/o N/V/D x 3 days; pt sts some generalized weakness and dizziness

## 2014-03-10 NOTE — Progress Notes (Signed)
ED CM consulted concerning medication assistance. Patient presented to [facility] with several days of vomiting and diarrhea. He presents today with several days of vomiting and diarrhea. History of insulin dependent diabetic. Reviewed record no PCP but health coverage [employer]. Met with patient at bedside, confirmed information. Patient states he has moved here from [facility] recently but has not been very compliant with insulin, was last on Levemir back in 2012. Discussed the benefits and importance of follow up care verbalized understanding. Offered to assist with finding a Medical Home, patient is agreeable. Discussed the [facility] and the services offered, patient agreeable to establishing care. Scheduled appt. with [place] for 1/18 10am at the walk-in clinic. Patient agreeable, stressed the importance of keeping appt. Patient verbalizes understanding clinic address and phone number provided. Teach back done. No further CM needs identified

## 2014-03-11 ENCOUNTER — Telehealth: Payer: Self-pay

## 2014-03-11 NOTE — Telephone Encounter (Signed)
Patient in ED on 03/09/14 with nausea, vomiting, diarrhea, and hyperglycemia. Patient does not have a PCP and plans to establish care at [facility]  He has an appointment on 03/14/14 at 1000 at [facility].  Patient sent home with script for Levemir 10 U daily. Call placed to patient for follow-up, to remind him of appointment, and to determine if he picked up medication and if taking insulin as prescribed. Unable to reach patient; left voicemail requesting return call.

## 2014-03-14 ENCOUNTER — Ambulatory Visit: Payer: BLUE CROSS/BLUE SHIELD | Attending: Family Medicine | Admitting: Family Medicine

## 2014-03-14 ENCOUNTER — Encounter: Payer: Self-pay | Admitting: Family Medicine

## 2014-03-14 VITALS — BP 108/66 | HR 97 | Temp 98.0°F | Resp 16 | Ht 70.0 in | Wt 186.0 lb

## 2014-03-14 DIAGNOSIS — R7989 Other specified abnormal findings of blood chemistry: Secondary | ICD-10-CM

## 2014-03-14 DIAGNOSIS — E109 Type 1 diabetes mellitus without complications: Secondary | ICD-10-CM

## 2014-03-14 DIAGNOSIS — B353 Tinea pedis: Secondary | ICD-10-CM

## 2014-03-14 DIAGNOSIS — Z720 Tobacco use: Secondary | ICD-10-CM

## 2014-03-14 DIAGNOSIS — E119 Type 2 diabetes mellitus without complications: Secondary | ICD-10-CM

## 2014-03-14 DIAGNOSIS — F17200 Nicotine dependence, unspecified, uncomplicated: Secondary | ICD-10-CM

## 2014-03-14 DIAGNOSIS — R739 Hyperglycemia, unspecified: Secondary | ICD-10-CM

## 2014-03-14 DIAGNOSIS — Z113 Encounter for screening for infections with a predominantly sexual mode of transmission: Secondary | ICD-10-CM

## 2014-03-14 LAB — LIPID PANEL
Cholesterol: 124 mg/dL (ref 0–200)
HDL: 67 mg/dL (ref >39)
LDL CALC: 47 mg/dL (ref 0–99)
Total CHOL/HDL Ratio: 1.9 Ratio
Triglycerides: 51 mg/dL (ref <150)
VLDL: 10 mg/dL (ref 0–40)

## 2014-03-14 LAB — TSH: TSH: 4.567 u[IU]/mL — AB (ref 0.350–4.500)

## 2014-03-14 LAB — GLUCOSE, POCT (MANUAL RESULT ENTRY): POC GLUCOSE: 79 mg/dL (ref 70–99)

## 2014-03-14 LAB — POCT GLYCOSYLATED HEMOGLOBIN (HGB A1C): Hemoglobin A1C: 6.2

## 2014-03-14 MED ORDER — INSULIN GLARGINE 100 UNIT/ML ~~LOC~~ SOLN: 10.0000 [IU] | Freq: Every day | SUBCUTANEOUS | Status: AC

## 2014-03-14 MED ORDER — VARENICLINE TARTRATE 0.5 MG X 11 & 1 MG X 42 PO MISC: ORAL | Status: AC

## 2014-03-14 MED ORDER — VARENICLINE TARTRATE 0.5 MG X 11 & 1 MG X 42 PO MISC: ORAL | Status: DC

## 2014-03-14 MED ORDER — INSULIN REGULAR HUMAN 100 UNIT/ML IJ SOLN: 5.0000 [IU] | Freq: Three times a day (TID) | INTRAMUSCULAR | Status: DC

## 2014-03-14 MED ORDER — TERBINAFINE HCL 250 MG PO TABS: 250.0000 mg | ORAL_TABLET | Freq: Every day | ORAL | Status: AC

## 2014-03-14 MED ORDER — VARENICLINE TARTRATE 1 MG PO TABS: 1.0000 mg | ORAL_TABLET | Freq: Two times a day (BID) | ORAL | Status: AC

## 2014-03-14 NOTE — Progress Notes (Signed)
esstablish care Type 1 DM dx 1992 Wants to stop smoking interested in chantix Flu and PNA refused

## 2014-03-14 NOTE — Assessment & Plan Note (Addendum)
3. Smoking: Smoking cessation support: smoking cessation hotline: 1-800-QUIT-NOW.  Smoking cessation classes are available through [facility]. Call [phone] or visit our website at [email or web address]. Set quit date, start chantix, quit 7 days after starting chantix

## 2014-03-14 NOTE — Progress Notes (Signed)
   Subjective:    Patient ID: Mathew Wilkerson, male    DOB: 15-Nov-1980, 34 y.o.   MRN: [id number] CC: establish care, IDDM type 1,  HPI 34 yo M establish care:  1. 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 Taking humulin R as needed based on how he feels because he did not have a meter and was buying the insulin OTC. Feels poorly when sugars are above 200. Feels fine when sugars are normal or low. Reports sugars down to 20 and he is awake and alert. Denies fever, chills, weight loss, CP, SOB, GI upset. Admits to tingling in L hand. Admits to dry itchy feet with odor, using antifungal spray. Drinks ETOH on the weekends. Smokes 1/2 PPD.   Soc Hx: current smoker 1/2 PPD, ready to quit Fam Hx: DM in father and PGF Surg Hx: negative   Review of Systems As per HPI  Denies SI    Objective:   Physical Exam BP 108/66 mmHg  Pulse 97  Temp(Src) 98 F (36.7 C)  Resp 16  Ht 5\' 10"  (1.778 m)  Wt 186 lb (84.369 kg)  BMI 26.69 kg/m2  SpO2 99% General appearance: alert, cooperative and no distress Lungs: clear to auscultation bilaterally Heart: regular rate and rhythm, S1, S2 normal, no murmur, click, rub or gallop Extremities: extremities normal, atraumatic, no cyanosis or edema  Feet: drys and callused, no ulceration, maceration between toes, darkened and thickened toenails.  Diabetic foot exam done:    Lab Results  Component Value Date   HGBA1C 6.20 03/14/2014        Assessment & Plan:

## 2014-03-14 NOTE — Assessment & Plan Note (Signed)
1. Diabetes: Continue lantus 10 U daily Checking sugars 3 times daily fasting (before breakfast), before lunch, before dinner and if feeling poorly.  Weight based dosing of humulin R, 5 U three times daily with meals.  You may take another 2 U if CBG > 200 at meal time or your are having a lot of carbs Do not take if CBG > 70 Endocrinology referral placed

## 2014-03-14 NOTE — Patient Instructions (Signed)
Mr. Mathew Wilkerson,  Thank you for coming in today. It was a pleasure meeting you. I look forward to being your primary doctor.   1. Diabetes: Continue lantus 10 U daily Checking sugars 3 times daily fasting (before breakfast), before lunch, before dinner and if feeling poorly.  Weight based dosing of humulin R, 5 U three times daily with meals.  You may take another 2 U if CBG > 200 at meal time or your are having a lot of carbs Do not take if CBG > 70 Endocrinology referral placed  2. Foot fungus in nails and foot: lamisil 250 mg daily for 12 weeks  3. Smoking: Smoking cessation support: smoking cessation hotline: 1-800-QUIT-NOW.  Smoking cessation classes are available through [facility]. Call [phone] or visit our website at [email or web address]. Set quit date, start chantix, quit 7 days after starting chantix  F/u with nurse in 3 weeks with blood sugar log fpr DM1   F/u with me in 6 weeks for DM1, smoking cessation   Dr. Armen PickupFunches

## 2014-03-14 NOTE — Assessment & Plan Note (Signed)
Screening HIV  

## 2014-03-14 NOTE — Assessment & Plan Note (Signed)
2. Foot fungus in nails and foot: lamisil 250 mg daily for 12 weeks

## 2014-03-15 ENCOUNTER — Telehealth: Payer: Self-pay | Admitting: *Deleted

## 2014-03-15 LAB — HIV ANTIBODY (ROUTINE TESTING W REFLEX): HIV 1&2 Ab, 4th Generation: NONREACTIVE

## 2014-03-15 LAB — MICROALBUMIN / CREATININE URINE RATIO
Creatinine, Urine: 237.8 mg/dL
MICROALB/CREAT RATIO: 1.7 mg/g (ref 0.0–30.0)
Microalb, Ur: 0.4 mg/dL (ref <2.0)

## 2014-03-15 NOTE — Telephone Encounter (Signed)
Left voice message with normal results If any question return call 

## 2014-03-15 NOTE — Telephone Encounter (Signed)
-----   Message from Lora PaulaJosalyn C Funches, MD sent at 03/15/2014  8:09 AM EST ----- Screening HIV negative

## 2014-03-16 LAB — GLUTAMIC ACID DECARBOXYLASE AUTO ABS

## 2014-03-20 ENCOUNTER — Encounter (HOSPITAL_COMMUNITY): Payer: Self-pay | Admitting: Neurology

## 2014-03-20 ENCOUNTER — Emergency Department (HOSPITAL_COMMUNITY)
Admission: EM | Admit: 2014-03-20 | Discharge: 2014-03-20 | Discharge status: Against Medical Advice | Payer: BLUE CROSS/BLUE SHIELD | Attending: Emergency Medicine | Admitting: Emergency Medicine

## 2014-03-20 DIAGNOSIS — Z794 Long term (current) use of insulin: Secondary | ICD-10-CM | POA: Diagnosis not present

## 2014-03-20 DIAGNOSIS — E11649 Type 2 diabetes mellitus with hypoglycemia without coma: Secondary | ICD-10-CM | POA: Diagnosis not present

## 2014-03-20 DIAGNOSIS — Z720 Tobacco use: Secondary | ICD-10-CM | POA: Diagnosis not present

## 2014-03-20 DIAGNOSIS — E160 Drug-induced hypoglycemia without coma: Secondary | ICD-10-CM

## 2014-03-20 DIAGNOSIS — T383X5A Adverse effect of insulin and oral hypoglycemic [antidiabetic] drugs, initial encounter: Secondary | ICD-10-CM

## 2014-03-20 DIAGNOSIS — Z5329 Procedure and treatment not carried out because of patient's decision for other reasons: Secondary | ICD-10-CM

## 2014-03-20 DIAGNOSIS — Z79899 Other long term (current) drug therapy: Secondary | ICD-10-CM | POA: Diagnosis not present

## 2014-03-20 DIAGNOSIS — R4182 Altered mental status, unspecified: Secondary | ICD-10-CM | POA: Diagnosis present

## 2014-03-20 DIAGNOSIS — Z5320 Procedure and treatment not carried out because of patient's decision for unspecified reasons: Secondary | ICD-10-CM

## 2014-03-20 LAB — CBC
HCT: 39.1 % (ref 39.0–52.0)
Hemoglobin: 13.4 g/dL (ref 13.0–17.0)
MCH: 33.8 pg (ref 26.0–34.0)
MCHC: 34.3 g/dL (ref 30.0–36.0)
MCV: 98.7 fL (ref 78.0–100.0)
Platelets: 327 10*3/uL (ref 150–400)
RBC: 3.96 MIL/uL — ABNORMAL LOW (ref 4.22–5.81)
RDW: 11.6 % (ref 11.5–15.5)
WBC: 7.5 10*3/uL (ref 4.0–10.5)

## 2014-03-20 LAB — COMPREHENSIVE METABOLIC PANEL
ALK PHOS: 77 U/L (ref 39–117)
ALT: 15 U/L (ref 0–53)
ANION GAP: 7 (ref 5–15)
AST: 32 U/L (ref 0–37)
Albumin: 4.4 g/dL (ref 3.5–5.2)
BILIRUBIN TOTAL: 0.6 mg/dL (ref 0.3–1.2)
BUN: 6 mg/dL (ref 6–23)
CO2: 28 mmol/L (ref 19–32)
Calcium: 9.1 mg/dL (ref 8.4–10.5)
Chloride: 107 mmol/L (ref 96–112)
Creatinine, Ser: 0.98 mg/dL (ref 0.50–1.35)
GFR calc Af Amer: >90 mL/min (ref >90)
GFR calc non Af Amer: >90 mL/min (ref >90)
Glucose, Bld: 40 mg/dL — CL (ref 70–99)
Potassium: 3.4 mmol/L — ABNORMAL LOW (ref 3.5–5.1)
Sodium: 142 mmol/L (ref 135–145)
Total Protein: 7.8 g/dL (ref 6.0–8.3)

## 2014-03-20 LAB — I-STAT CHEM 8, ED
BUN: 6 mg/dL (ref 6–23)
CHLORIDE: 107 mmol/L (ref 96–112)
Calcium, Ion: 1.09 mmol/L — ABNORMAL LOW (ref 1.12–1.23)
Creatinine, Ser: 1.1 mg/dL (ref 0.50–1.35)
GLUCOSE: 42 mg/dL — AB (ref 70–99)
HEMATOCRIT: 44 % (ref 39.0–52.0)
Hemoglobin: 15 g/dL (ref 13.0–17.0)
POTASSIUM: 3.4 mmol/L — AB (ref 3.5–5.1)
SODIUM: 144 mmol/L (ref 135–145)
TCO2: 21 mmol/L (ref 0–100)

## 2014-03-20 LAB — CBG MONITORING, ED
GLUCOSE-CAPILLARY: 28 mg/dL — AB (ref 70–99)
Glucose-Capillary: 160 mg/dL — ABNORMAL HIGH (ref 70–99)

## 2014-03-20 MED ORDER — DEXTROSE 50 % IV SOLN
INTRAVENOUS | Status: AC
  Filled 2014-03-20: qty 50

## 2014-03-20 MED ORDER — DEXTROSE 50 % IV SOLN
50.0000 mL | Freq: Once | INTRAVENOUS | Status: AC
  Administered 2014-03-20: 50 mL via INTRAVENOUS

## 2014-03-20 NOTE — ED Notes (Signed)
Per EMS- Pt comes from home, girlfriend call EMS for coughing which signals to her low blood sugar with hx of diabetes. CBG 91 for EMS, pt drooling and slow RR was placed on NRB. Pt speech is slurred and disoriented. Pt noted to be moving arms, but little movement seen in legs. Given 2.5 midazolam, reports increased response after this given. BP initially 180/110, 140/53 most recent. HR 100-130 ST. Girlfriend reports 1 beer, no drug use.

## 2014-03-20 NOTE — ED Provider Notes (Signed)
CSN: [id number]     Arrival date & time 03/20/14  [id number] History   First MD Initiated Contact with Patient 03/20/14 0830     Chief Complaint  Patient presents with  . Altered Mental Status     (Consider location/radiation/quality/duration/timing/severity/associated sxs/prior Treatment) HPI Upon arrival the patient was confused with unintelligible speech. He was following commands and had symmetric use of all 4 extremities. At this point after administration of 1 amp of D50, the patient is alert and well in appearance giving full history. The patient reports that he was drinking alcohol yesterday evening and felt that his carb intake would be increased, he therefore he took an additional dose of Lantus at about 2 AM. He reports he normally takes 10 units at 8 AM in the morning, so he gave himself 5 units at that 2 AM dosing. He reports he was aware of all of the things going on around him while the medics were treating him. He was trying to express that his blood sugar was low but he couldn't talk appropriately. He reports his girlfriend was trying to give him juice at home but he was not able to get his blood sugar up. Past Medical History  Diagnosis Date  . Diabetes mellitus without complication     1992 dx   History reviewed. No pertinent past surgical history. Family History  Problem Relation Age of Onset  . Diabetes Father     type 2, borderline   . Diabetes Paternal Grandfather    History  Substance Use Topics  . Smoking status: Current Some Day Smoker -- 0.50 packs/day for 12 years    Types: Cigarettes  . Smokeless tobacco: Not on file  . Alcohol Use: Yes    Review of Systems  10 Systems reviewed and are negative for acute change except as noted in the HPI.   Allergies  Review of patient's allergies indicates no known allergies.  Home Medications   Prior to Admission medications   Medication Sig Start Date End Date Taking? Authorizing Provider  insulin glargine  (LANTUS) 100 UNIT/ML injection Inject 0.1 mLs (10 Units total) into the skin daily. 03/14/14   Josalyn C Funches, MD  insulin regular (HUMULIN R) 100 units/mL injection Inject 0.05 mLs (5 Units total) into the skin 3 (three) times daily before meals. 03/14/14   Josalyn C Funches, MD  terbinafine (LAMISIL) 250 MG tablet Take 1 tablet (250 mg total) by mouth daily. For 12 weeks 03/14/14   Lora PaulaJosalyn C Funches, MD  varenicline (CHANTIX CONTINUING MONTH PAK) 1 MG tablet Take 1 tablet (1 mg total) by mouth 2 (two) times daily. 03/14/14   Lora PaulaJosalyn C Funches, MD  varenicline (CHANTIX STARTING MONTH PAK) 0.5 MG X 11 & 1 MG X 42 tablet Take one 0.5 mg tablet by mouth once daily for 3 days, then increase to one 0.5 mg tablet twice daily for 4 days, then increase to one 1 mg tablet twice daily. 03/14/14   Josalyn C Funches, MD   BP 106/59 mmHg  Pulse 80  Temp(Src) 99.1 F (37.3 C) (Oral)  Resp 20  SpO2 99% Physical Exam  Constitutional: He is oriented to person, place, and time. He appears well-developed and well-nourished.  HENT:  Head: Normocephalic and atraumatic.  Mouth/Throat: Oropharynx is clear and moist.  Eyes: Conjunctivae and EOM are normal. Pupils are equal, round, and reactive to light.  Neck: Neck supple.  Cardiovascular: Normal rate, regular rhythm, normal heart sounds and intact distal pulses.  Pulmonary/Chest: Effort normal and breath sounds normal.  Abdominal: Soft. Bowel sounds are normal. He exhibits no distension. There is no tenderness.  Musculoskeletal: Normal range of motion. He exhibits no edema.  Neurological: He is alert and oriented to person, place, and time. He has normal strength. Coordination normal. GCS eye subscore is 4. GCS verbal subscore is 5. GCS motor subscore is 6.  First examination showed the patient to be confused in appearance. He made verbal responses but they were unintelligible. He was holding his arms out in front of him and able to move all 4 extremities and  follow commands to assist in sitting forward and allowing [place] to disrobe him. After his D50 was administered the patient was alert and active with no sign of neurologic or medical abnormality. His speech was brisk and clear with normal content. None of his movements showed any type of delay or incoordination.  Skin: Skin is warm, dry and intact.  Psychiatric: He has a normal mood and affect.    ED Course  Procedures (including critical care time) Labs Review Labs Reviewed  CBC - Abnormal; Notable for the following:    RBC 3.96 (*)    All other components within normal limits  COMPREHENSIVE METABOLIC PANEL - Abnormal; Notable for the following:    Potassium 3.4 (*)    Glucose, Bld 40 (*)    All other components within normal limits  CBG MONITORING, ED - Abnormal; Notable for the following:    Glucose-Capillary 28 (*)    All other components within normal limits  I-STAT CHEM 8, ED - Abnormal; Notable for the following:    Potassium 3.4 (*)    Glucose, Bld 42 (*)    Calcium, Ion 1.09 (*)    All other components within normal limits  CBG MONITORING, ED - Abnormal; Notable for the following:    Glucose-Capillary 160 (*)    All other components within normal limits  ETHANOL  URINE RAPID DRUG SCREEN (HOSP PERFORMED)    Imaging Review No results found.   EKG Interpretation None      MDM   Final diagnoses:  Hypoglycemia due to insulin  Left against medical advice   Once the patient was treated he wanted to leave the hospital. I did counsel him that the Lantus had a longer duration of action and he may experience a repeat episode of hypoglycemia. I advised that we should observe him in the hospital for several hours and make sure there was no rebound of symptoms. The patient determined he wanted to sign out AMA now that his symptoms were resolved and he felt that he could manage this at home. His mental status is clear, objectively he shows no signs of any acute drug or alcohol  intoxication. He is well in appearance without objective signs of infectious illness.    Arby Barrette, MD 03/20/14 [phone]

## 2014-03-20 NOTE — ED Notes (Signed)
Pt is ambulatory to discharge with family. CBG checked at time of discharge CBG 150. Pt provided orange juice for discharge. Advised to come back if needed.

## 2014-03-20 NOTE — ED Notes (Signed)
Pt is now a x 4. Speaking in clear, concise sentences. Following commands. Is drinking orange juice.

## 2014-03-20 NOTE — ED Notes (Signed)
Pt drinking OJ 8 oz, eating Malaw[place]key sandwich.

## 2014-03-20 NOTE — ED Notes (Signed)
Pt requesting to leave because he is feeling much better. Dr. Broadus JohnPfieffer made aware. Pt is leaving AMA. Pt made aware of risks. Advised we'd like him to stay for 3 hr obs for sugar checks. Reports he wants  To leave. Pt is a x 4. Vitals stable.

## 2014-03-20 NOTE — ED Notes (Signed)
Chem 8 results given to Dr. Donnald GarrePfeiffer

## 2014-03-23 ENCOUNTER — Encounter: Payer: Self-pay | Admitting: Endocrinology

## 2014-03-23 ENCOUNTER — Ambulatory Visit (INDEPENDENT_AMBULATORY_CARE_PROVIDER_SITE_OTHER): Payer: BLUE CROSS/BLUE SHIELD | Admitting: Endocrinology

## 2014-03-23 ENCOUNTER — Other Ambulatory Visit: Payer: Self-pay

## 2014-03-23 VITALS — BP 136/64 | HR 108 | Temp 98.4°F | Ht 70.0 in | Wt 188.0 lb

## 2014-03-23 DIAGNOSIS — E119 Type 2 diabetes mellitus without complications: Secondary | ICD-10-CM

## 2014-03-23 MED ORDER — GLUCOSE BLOOD VI STRP: ORAL_STRIP | Status: AC

## 2014-03-23 MED ORDER — INSULIN ASPART 100 UNIT/ML FLEXPEN: 5.0000 [IU] | PEN_INJECTOR | Freq: Three times a day (TID) | SUBCUTANEOUS | Status: AC

## 2014-03-23 MED ORDER — INSULIN REGULAR HUMAN 100 UNIT/ML IJ SOLN: 5.0000 [IU] | Freq: Three times a day (TID) | INTRAMUSCULAR | Status: DC

## 2014-03-23 MED ORDER — GLUCOSE BLOOD VI STRP: 1.0000 | ORAL_STRIP | Freq: Four times a day (QID) | Status: DC

## 2014-03-23 NOTE — Progress Notes (Signed)
Subjective:    Patient ID: Mathew Wilkerson, male    DOB: 1980/09/22, 34 y.o.   MRN: [id number]  HPI pt states DM was dx'ed in 1992; he has mild neuropathy of the lower extremities; he is unaware of any associated chronic complications; he has been on insulin since dx; pt says his diet irs poor, but exercise is ok; he has never had pancreatitis, severe hypoglycemia or DKA.  He recently regained his health insurance.  He has had over 20 episodes of severe hypoglycemia (most recently 1 week ago).  He has had 2 episodes of DKA (most recent was approx 2005).   Past Medical History  Diagnosis Date  . Diabetes mellitus without complication     1992 dx    No past surgical history on file.  History   Social History  . Marital Status: Single    Spouse Name: N/A    Number of Children: N/A  . Years of Education: N/A   Occupational History  . Not on file.   Social History Main Topics  . Smoking status: Current Some Day Smoker -- 0.50 packs/day for 12 years    Types: Cigarettes  . Smokeless tobacco: Not on file  . Alcohol Use: Yes  . Drug Use: No  . Sexual Activity: Not on file   Other Topics Concern  . Not on file   Social History Narrative    Current Outpatient Prescriptions on File Prior to Visit  Medication Sig Dispense Refill  . insulin glargine (LANTUS) 100 UNIT/ML injection Inject 0.1 mLs (10 Units total) into the skin daily. 10 mL 2  . terbinafine (LAMISIL) 250 MG tablet Take 1 tablet (250 mg total) by mouth daily. For 12 weeks 42 tablet 1  . varenicline (CHANTIX CONTINUING MONTH PAK) 1 MG tablet Take 1 tablet (1 mg total) by mouth 2 (two) times daily. 60 tablet 1  . varenicline (CHANTIX STARTING MONTH PAK) 0.5 MG X 11 & 1 MG X 42 tablet Take one 0.5 mg tablet by mouth once daily for 3 days, then increase to one 0.5 mg tablet twice daily for 4 days, then increase to one 1 mg tablet twice daily. 53 tablet 0   No current facility-administered medications on file prior to  visit.    No Known Allergies  Family History  Problem Relation Age of Onset  . Diabetes Father     type 2, borderline   . Diabetes Paternal Grandfather     BP 136/64 mmHg  Pulse 108  Temp(Src) 98.4 F (36.9 C) (Oral)  Ht  (1.778 m)  Wt 188 lb (85.276 kg)  BMI 26.98 kg/m2  SpO2 97%   Review of Systems denies weight loss, blurry vision, headache, chest pain, sob, urinary frequency, muscle cramps, excessive diaphoresis, depression, cold intolerance, rhinorrhea, and easy bruising.  N/v is resolved.      Objective:   Physical Exam VS: see vs page GEN: no distress HEAD: head: no deformity eyes: no periorbital swelling, no proptosis external nose and ears are normal mouth: no lesion seen NECK: supple, thyroid is not enlarged CHEST WALL: no deformity LUNGS: clear to auscultation BREASTS:  No gynecomastia CV: reg rate and rhythm, no murmur ABD: abdomen is soft, nontender.  no hepatosplenomegaly.  not distended.  no hernia. MUSCULOSKELETAL: muscle bulk and strength are grossly normal.  no obvious joint swelling.  gait is normal and steady EXTEMITIES: no deformity.  no ulcer on the feet.  feet are of normal color and temp.  no edema.  There is bilateral onychomycosis PULSES: dorsalis pedis intact bilat.  no carotid bruit NEURO:  cn 2-12 grossly intact.   readily moves all 4's.  sensation is intact to touch on the feet, but decreased from normal SKIN:  Normal texture and temperature.  No rash or suspicious lesion is visible.   NODES:  None palpable at the neck.   PSYCH: alert, well-oriented.  Does not appear anxious nor depressed.    outside test results are reviewed: A1c=6.2     Assessment & Plan:  DM: new to me mild exacerbation Side-effect of rx: severe hypoglycemia.    Patient is advised the following: Patient Instructions  good diet and exercise habits significanly improve the control of your diabetes.  please let me know if you wish to be referred to a  dietician.  high blood sugar is very risky to your health.  you should see an eye doctor and dentist every year.  It is very important to get all recommended vaccinations.  controlling your blood pressure and cholesterol drastically reduces the damage diabetes does to your body.  Those who smoke should quit.  please discuss these with your doctor.  check your blood sugar 4 times a day: before the 3 meals, and at bedtime.  also check if you have symptoms of your blood sugar being too high or too low.  please keep a record of the readings and bring it to your next appointment here.  You can write it on any piece of paper.  please call us sooner if your blood sugar goes below 70, or if you have a lot of readings over 200. Please continue the same insulin for now, except that i have changed the regular to novolog. Please come back 2-3 days after starting the pump.

## 2014-03-23 NOTE — Patient Instructions (Addendum)
good diet and exercise habits significanly improve the control of your diabetes.  please let me know if you wish to be referred to a dietician.  high blood sugar is very risky to your health.  you should see an eye doctor and dentist every year.  It is very important to get all recommended vaccinations.  controlling your blood pressure and cholesterol drastically reduces the damage diabetes does to your body.  Those who smoke should quit.  please discuss these with your doctor.  check your blood sugar 4 times a day: before the 3 meals, and at bedtime.  also check if you have symptoms of your blood sugar being too high or too low.  please keep a record of the readings and bring it to your next appointment here.  You can write it on any piece of paper.  please call us sooner if your blood sugar goes below 70, or if you have a lot of readings over 200. Please continue the same insulin for now, except that i have changed the regular to novolog. Please come back 2-3 days after starting the pump.

## 2014-03-24 LAB — ANTI-ISLET CELL ANTIBODY: PANCREATIC ISLET CELL ANTIBODY: 5 {JDF'U} — AB (ref <5)

## 2014-03-28 ENCOUNTER — Telehealth: Payer: Self-pay | Admitting: *Deleted

## 2014-03-28 DIAGNOSIS — R7989 Other specified abnormal findings of blood chemistry: Secondary | ICD-10-CM | POA: Insufficient documentation

## 2014-03-28 NOTE — Assessment & Plan Note (Signed)
Slightly elevated TSH will need repeat TSH and free T3 and T4.

## 2014-03-28 NOTE — Telephone Encounter (Signed)
Left voice message to return call 

## 2014-03-28 NOTE — Addendum Note (Signed)
Addended by: Dessa PhiFUNCHES, Treyvon Blahut on: 03/28/2014 09:30 AM   Modules accepted: Orders

## 2014-03-28 NOTE — Telephone Encounter (Signed)
-----   Message from Lora PaulaJosalyn C Funches, MD sent at 03/28/2014  9:29 AM EST ----- Normal urine microalbumin, normal lipids.  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  Slightly elevated TSH will need repeat TSH and free T3 and T4.

## 2015-09-18 IMAGING — US US ABDOMEN LIMITED
1 series · 14 of 25 positions shown · non-contrast
Comparison: None.

CLINICAL DATA: Abdominal pain. History of diabetes. Initial
encounter.

EXAM:
US ABDOMEN LIMITED - RIGHT UPPER QUADRANT

[Series 1: us abdomen limited · 0.26mm/px · 14 of 25 slices shown]
[im 1/25]
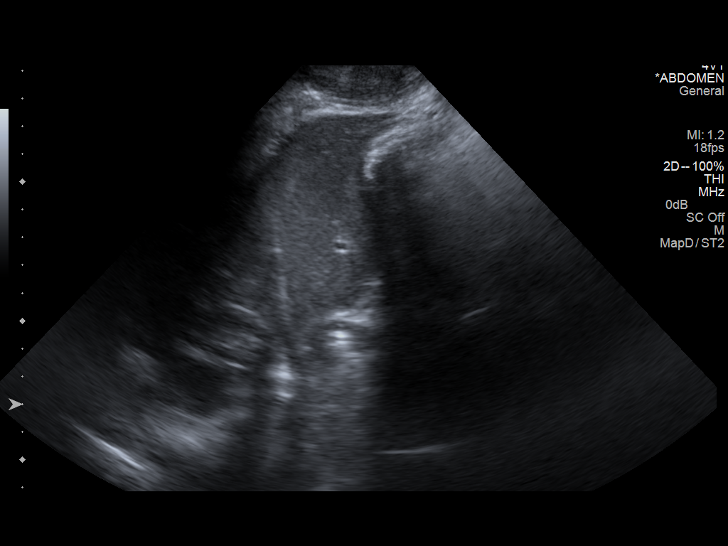
[im 3/25]
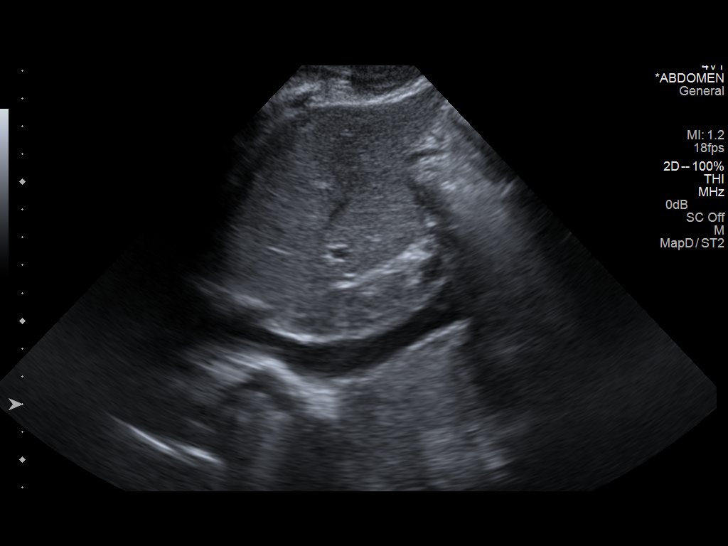
[im 5/25]
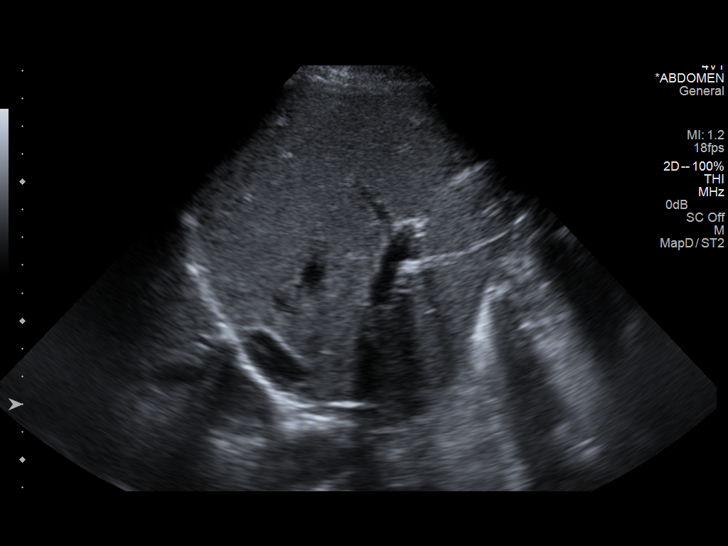
[im 7/25]
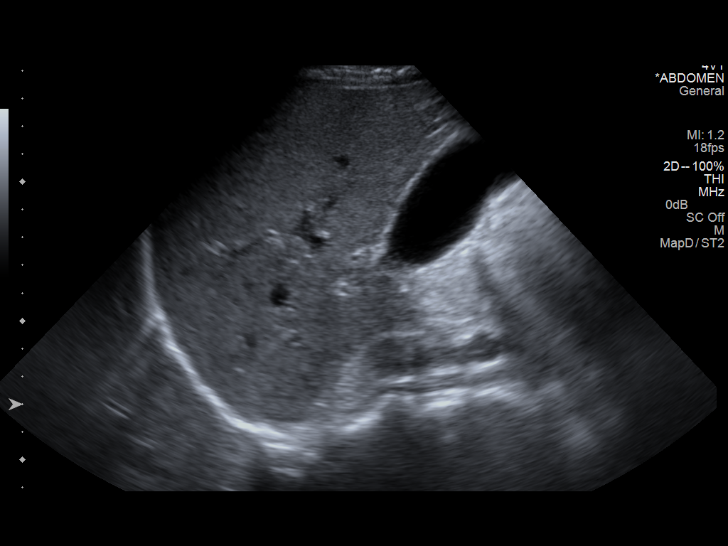
[im 9/25]
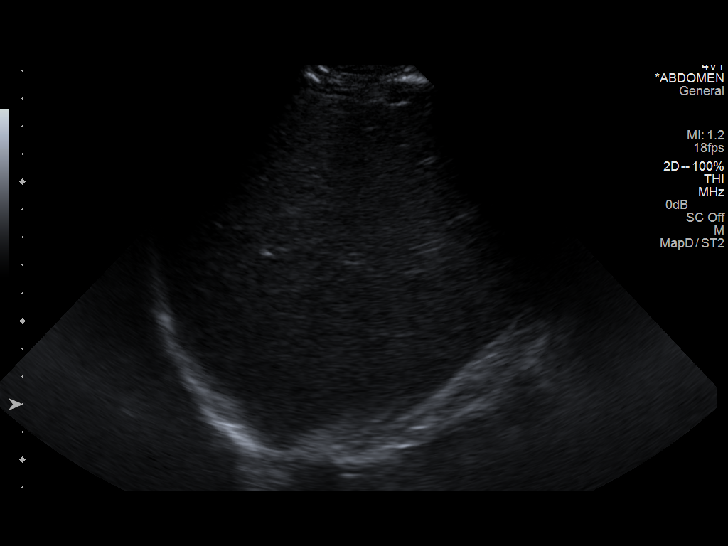
[im 10/25]
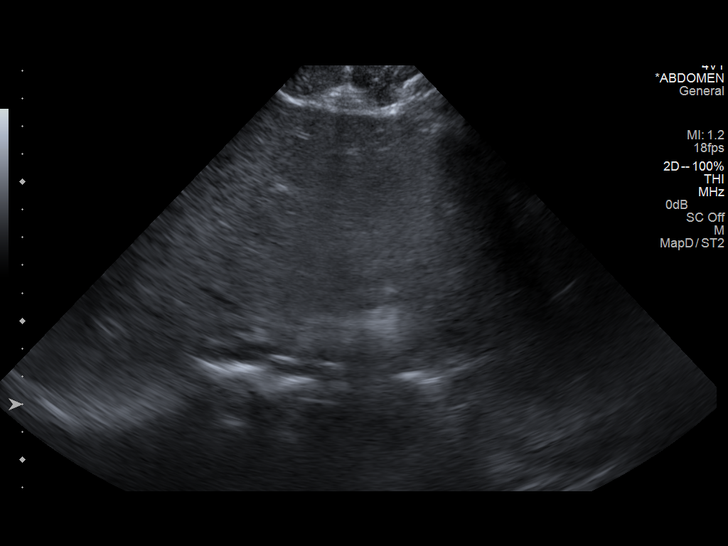
[im 12/25]
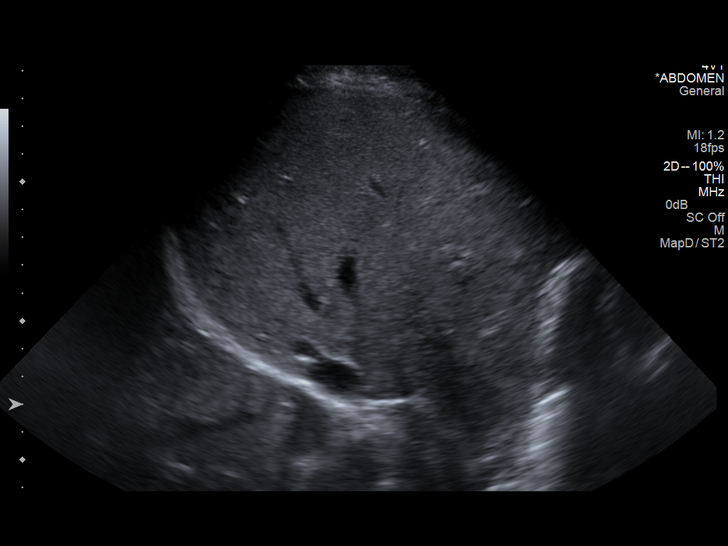
[im 14/25]
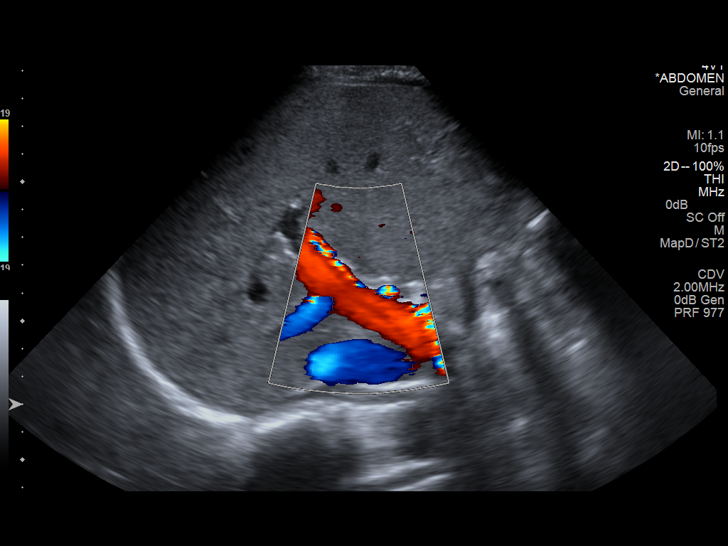
[im 16/25]
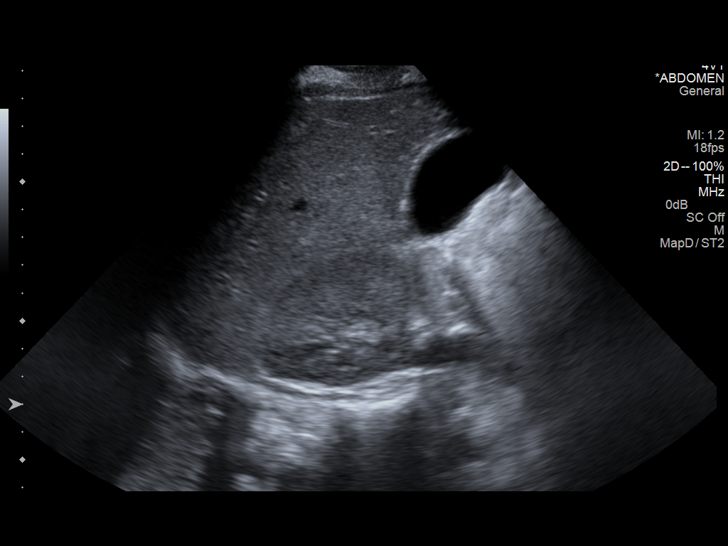
[im 17/25]
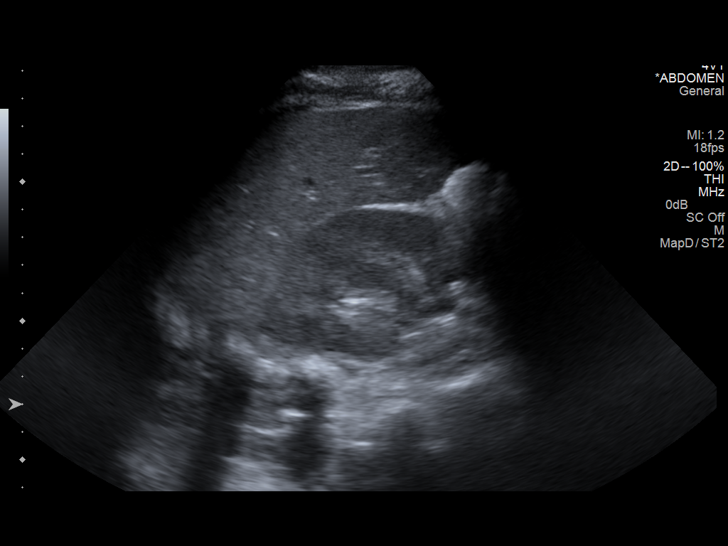
[im 19/25]
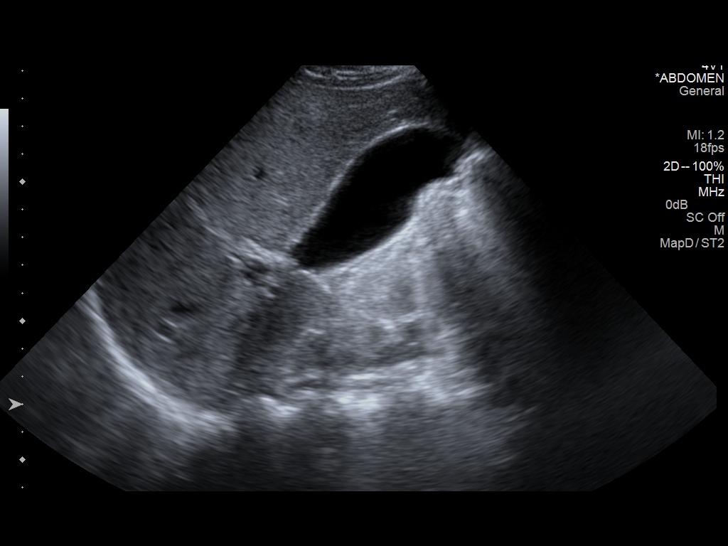
[im 21/25]
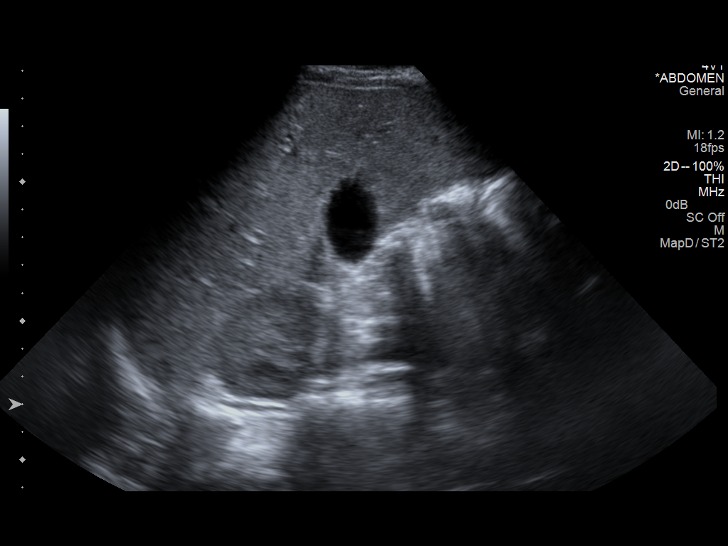
[im 23/25]
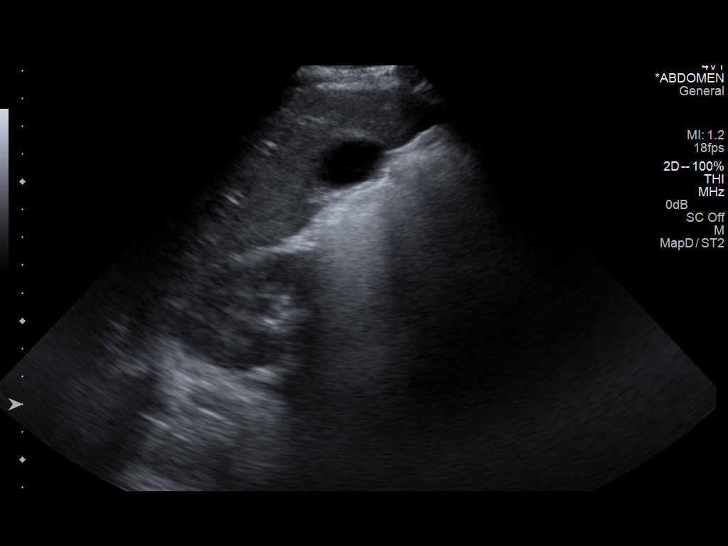
[im 25/25]
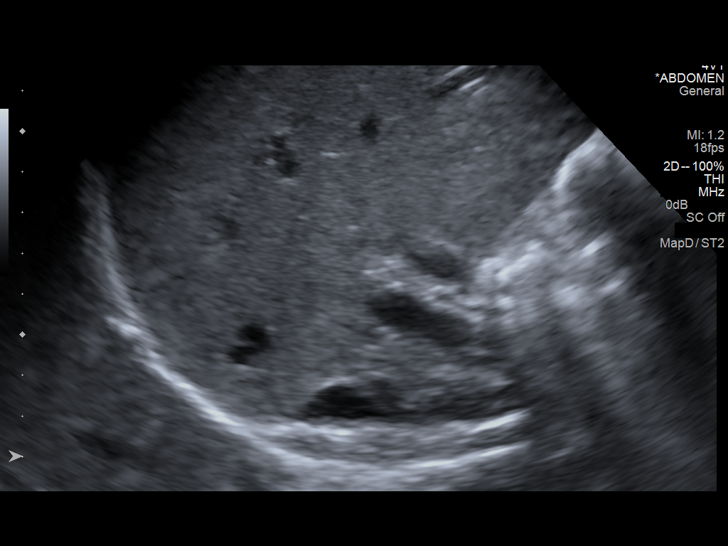

[14 of 25 positions shown; findings below may reference images not displayed]

FINDINGS: Gallbladder:

No gallstones or wall thickening visualized. No sonographic Murphy
sign noted.

Common bile duct:

Diameter: 2.5 mm.  No evidence of choledocholithiasis.

Liver:

No focal lesion identified. Within normal limits in parenchymal
echogenicity.
IMPRESSION: Normal right upper quadrant abdominal ultrasound.
